# Patient Record
Sex: Female | Born: 1995 | Race: Black or African American | Hispanic: No | Marital: Single | Smoking: Never smoker
Health system: Southern US, Community
[De-identification: ages and names within clinical notes are randomized; demographics above are authoritative.]

## PROBLEM LIST (undated history)

## (undated) HISTORY — PX: HERNIA REPAIR: SHX51

## (undated) HISTORY — PX: UMBILICAL HERNIA REPAIR: SHX196

---
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
## 2016-10-11 ENCOUNTER — Ambulatory Visit (HOSPITAL_COMMUNITY)
Admission: EM | Admit: 2016-10-11 | Discharge: 2016-10-11 | Disposition: A | Discharge status: Home / Self Care | Payer: Managed Care, Other (non HMO) | Attending: Internal Medicine | Admitting: Internal Medicine

## 2016-10-11 ENCOUNTER — Encounter (HOSPITAL_COMMUNITY): Payer: Self-pay | Admitting: *Deleted

## 2016-10-11 DIAGNOSIS — N898 Other specified noninflammatory disorders of vagina: Secondary | ICD-10-CM | POA: Diagnosis not present

## 2016-10-11 MED ORDER — FLUCONAZOLE 150 MG PO TABS: 150.0000 mg | ORAL_TABLET | Freq: Every day | ORAL | 0 refills | Status: AC

## 2016-10-11 NOTE — ED Provider Notes (Signed)
CSN: [id number]     Arrival date & time 10/11/16  1802 History   None    Chief Complaint  Patient presents with  . Vaginal Itching   (Consider location/radiation/quality/duration/timing/severity/associated sxs/prior Treatment) 21 year old female comes in with a few day history of vaginal discharge and itching. Describes discharge has white, cottage cheese like. Patient states she was recently seen and treated for an yeast infection. She believed yeast infection was caused by a new body wash she recently started using. After symptoms resolved, she resumed use of same body wash, and is having the same symptoms again. She is sexually active with one partner, with condom use. LMP 09/26/16. Denies vaginal pain, spotting. Has had some urinary frequency, denies dysuria, hematuria. Denies abdominal pain, N/V/D.       Past Medical History:  Diagnosis Date  . Asthma    Past Surgical History:  Procedure Laterality Date  . HERNIA REPAIR    . UMBILICAL HERNIA REPAIR     History reviewed. No pertinent family history. Social History  Substance Use Topics  . Smoking status: Never Smoker  . Smokeless tobacco: Not on file  . Alcohol use No   OB History    No data available     Review of Systems  Constitutional: Negative for chills, diaphoresis and fever.  Genitourinary: Positive for frequency and vaginal discharge. Negative for decreased urine volume, difficulty urinating, dyspareunia, dysuria, flank pain, hematuria, menstrual problem, pelvic pain, urgency, vaginal bleeding and vaginal pain.    Allergies  Patient has no known allergies.  Home Medications   Prior to Admission medications   Medication Sig Start Date End Date Taking? Authorizing Provider  albuterol (PROVENTIL HFA;VENTOLIN HFA) 108 (90 BASE) MCG/ACT inhaler Inhale 2 puffs into the lungs every 6 (six) hours as needed.      [provider]  fluconazole (DIFLUCAN) 150 MG tablet Take 1 tablet (150 mg total) by mouth  daily. 10/11/16   Belinda Fisher, PA-C   Meds Ordered and Administered this Visit  Medications - No data to display  BP (!) 153/89   Pulse 89   Temp 98.1 F (36.7 C) (Oral)   Resp 16   LMP 09/26/2016 (Exact Date)   SpO2 100%  No data found.   Physical Exam  Constitutional: She is oriented to person, place, and time. She appears well-developed and well-nourished. No distress.  HENT:  Head: Normocephalic and atraumatic.  Eyes: Pupils are equal, round, and reactive to light. Conjunctivae are normal.  Cardiovascular: Normal rate, regular rhythm and normal heart sounds.  Exam reveals no gallop and no friction rub.   No murmur heard. Pulmonary/Chest: Effort normal and breath sounds normal. No respiratory distress. She has no wheezes. She has no rales.  Abdominal: Soft. Bowel sounds are normal. She exhibits no mass. There is no tenderness. There is no rebound and no guarding.  Neurological: She is alert and oriented to person, place, and time.  Skin: Skin is warm and dry.  Psychiatric: She has a normal mood and affect. Her behavior is normal. Judgment normal.    Urgent Care Course     Procedures (including critical care time)  Labs Review Labs Reviewed - No data to display  Imaging Review No results found.          MDM   1. Vaginal discharge    Discussed with patient symptoms and description of discharge most consistent with vaginal candidiasis. Patient declined pelvic exam and urine cytology. Start diflucan 150mg  one  time dose. Patient to stop using new body soap. Patient to monitor for fever, abdominal pain, N/V, to follow up for reevaluation.    Belinda FisherYu, Amy V, PA-C 10/11/16 [phone]

## 2016-10-11 NOTE — ED Triage Notes (Signed)
States recently was treated for yeast infection with Diflucan - had improved, but then washed with irritating soap, now feels she has yeast infection again.  C/O "cottage cheese-like" discharge.

## 2020-03-01 ENCOUNTER — Other Ambulatory Visit: Payer: Self-pay

## 2020-03-01 DIAGNOSIS — Z20822 Contact with and (suspected) exposure to covid-19: Secondary | ICD-10-CM

## 2020-03-02 LAB — NOVEL CORONAVIRUS, NAA: SARS-CoV-2, NAA: NOT DETECTED

## 2020-03-02 LAB — SARS-COV-2, NAA 2 DAY TAT
